# Patient Record
Sex: Female | Born: 1970 | Race: White | Hispanic: No | Marital: Married | Smoking: Former smoker
Health system: Southern US, Community
[De-identification: ages and names within clinical notes are randomized; demographics above are authoritative.]

## PROBLEM LIST (undated history)

## (undated) DIAGNOSIS — E039 Hypothyroidism, unspecified: Secondary | ICD-10-CM

## (undated) HISTORY — DX: Hypothyroidism, unspecified: E03.9

---
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
## 1997-06-28 ENCOUNTER — Other Ambulatory Visit: Admission: RE | Admit: 1997-06-28 | Discharge: 1997-06-28 | Payer: Self-pay | Admitting: Obstetrics and Gynecology

## 1998-07-05 ENCOUNTER — Other Ambulatory Visit: Admission: RE | Admit: 1998-07-05 | Discharge: 1998-07-05 | Payer: Self-pay | Admitting: Obstetrics and Gynecology

## 1999-09-29 ENCOUNTER — Other Ambulatory Visit: Admission: RE | Admit: 1999-09-29 | Discharge: 1999-09-29 | Payer: Self-pay | Admitting: *Deleted

## 2003-04-05 ENCOUNTER — Inpatient Hospital Stay (HOSPITAL_COMMUNITY): Admission: AD | Admit: 2003-04-05 | Discharge: 2003-04-05 | Payer: Self-pay | Admitting: Obstetrics & Gynecology

## 2003-10-06 ENCOUNTER — Inpatient Hospital Stay (HOSPITAL_COMMUNITY): Admission: AD | Admit: 2003-10-06 | Discharge: 2003-10-10 | Payer: Self-pay | Admitting: Obstetrics and Gynecology

## 2004-06-06 ENCOUNTER — Encounter: Admission: RE | Admit: 2004-06-06 | Discharge: 2004-06-06 | Payer: Self-pay | Admitting: Obstetrics and Gynecology

## 2004-08-21 ENCOUNTER — Ambulatory Visit: Payer: Self-pay

## 2006-05-31 ENCOUNTER — Inpatient Hospital Stay (HOSPITAL_COMMUNITY): Admission: AD | Admit: 2006-05-31 | Discharge: 2006-05-31 | Payer: Self-pay | Admitting: *Deleted

## 2006-07-12 ENCOUNTER — Inpatient Hospital Stay (HOSPITAL_COMMUNITY): Admission: RE | Admit: 2006-07-12 | Discharge: 2006-07-15 | Payer: Self-pay | Admitting: Obstetrics and Gynecology

## 2006-07-12 ENCOUNTER — Encounter (INDEPENDENT_AMBULATORY_CARE_PROVIDER_SITE_OTHER): Payer: Self-pay | Admitting: Specialist

## 2007-02-11 IMAGING — CR CERVICAL SPINE - COMPLETE 4+ VIEW
1 series · 6 of 6 positions shown · non-contrast
Comparison: none

REASON FOR EXAM: pain
COMMENTS:

[Series 1: view not recorded · 0.17mm/px · 6 of 6 slices shown]
[im 1/6]
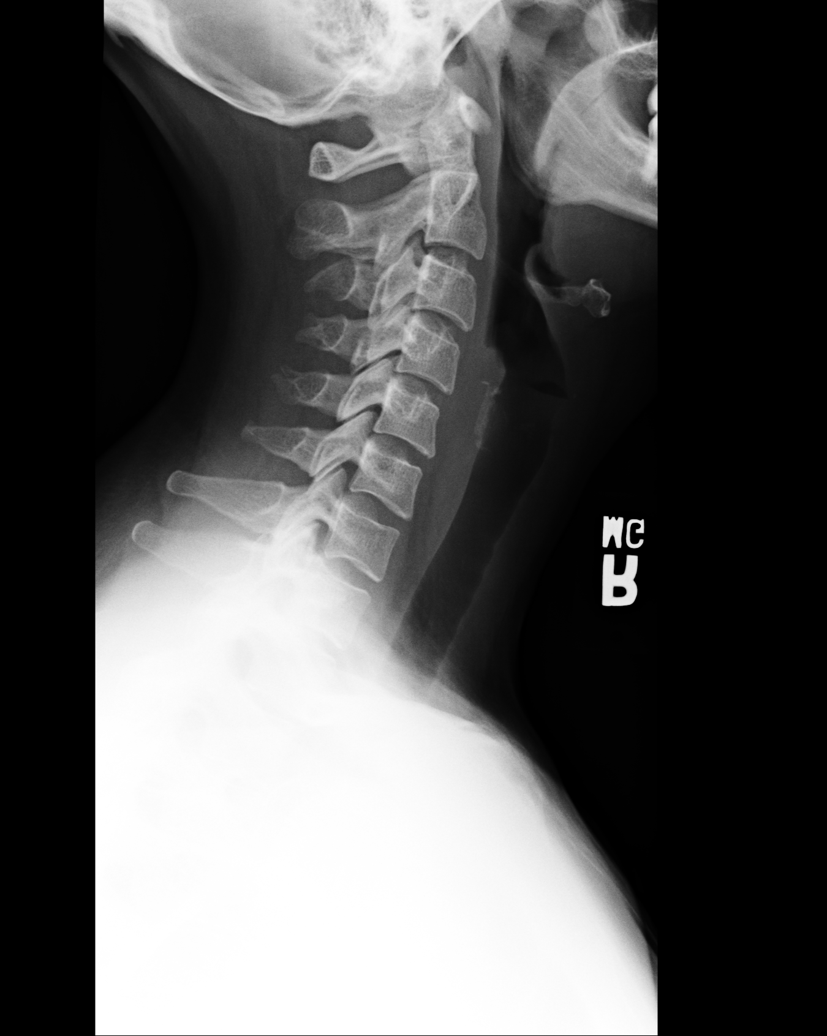
[im 2/6]
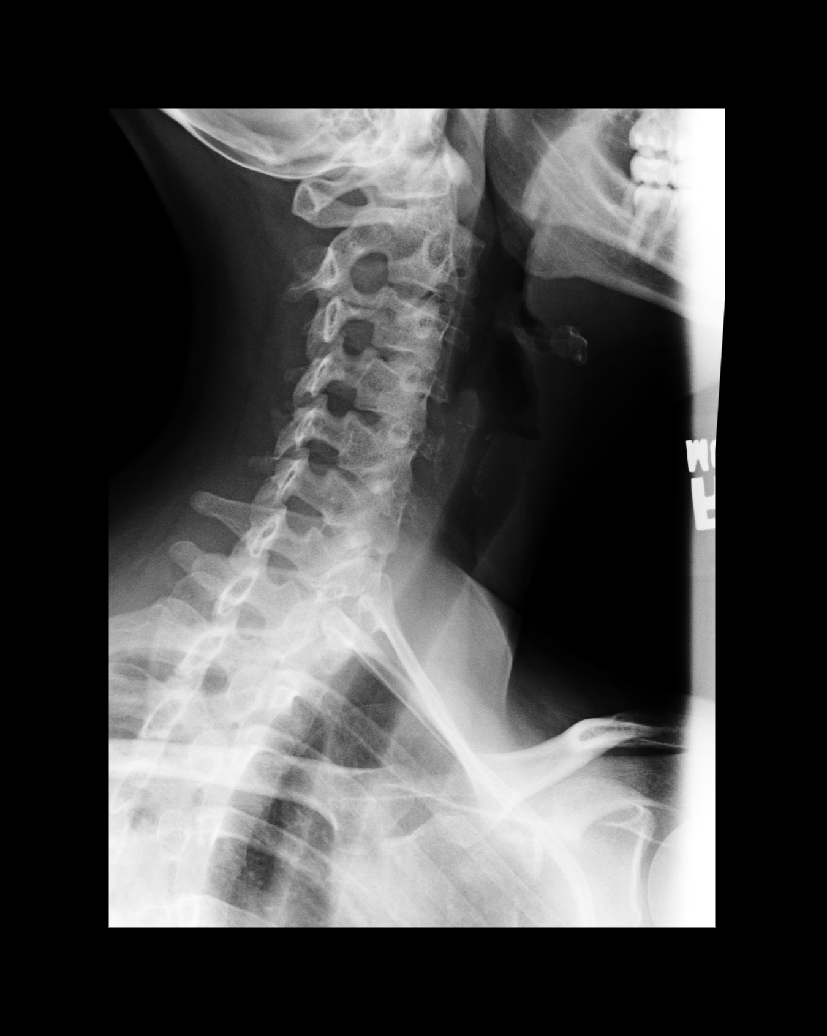
[im 3/6]
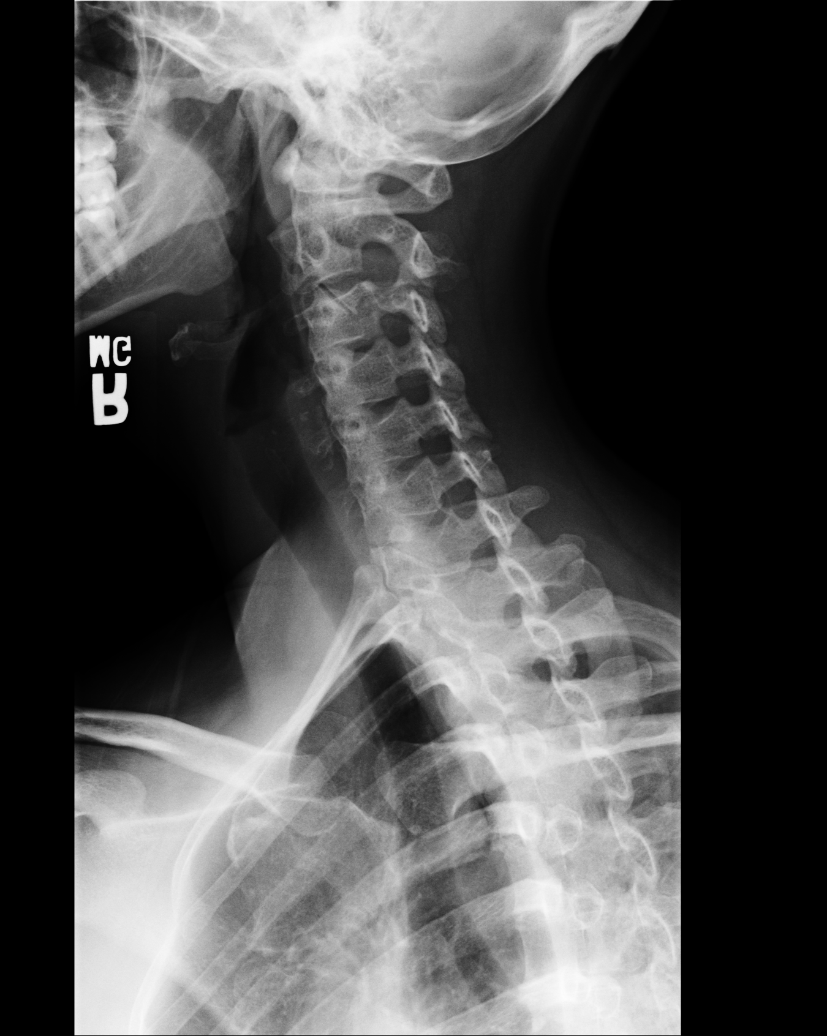
[im 4/6]
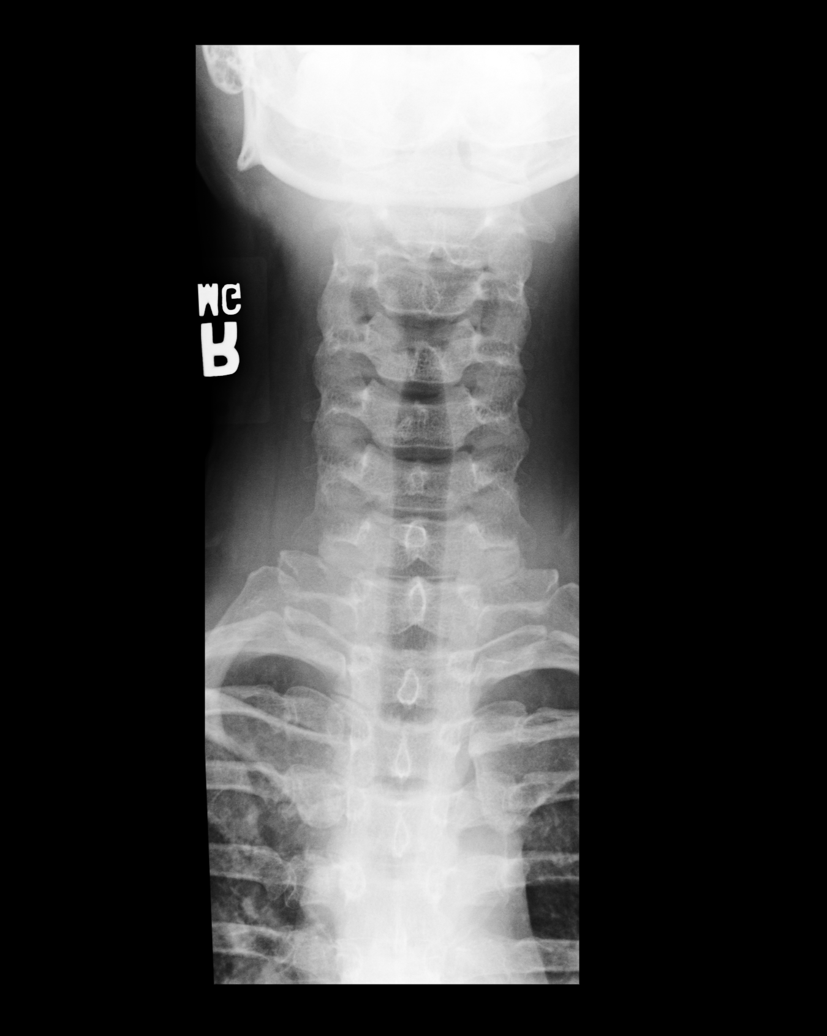
[im 5/6]
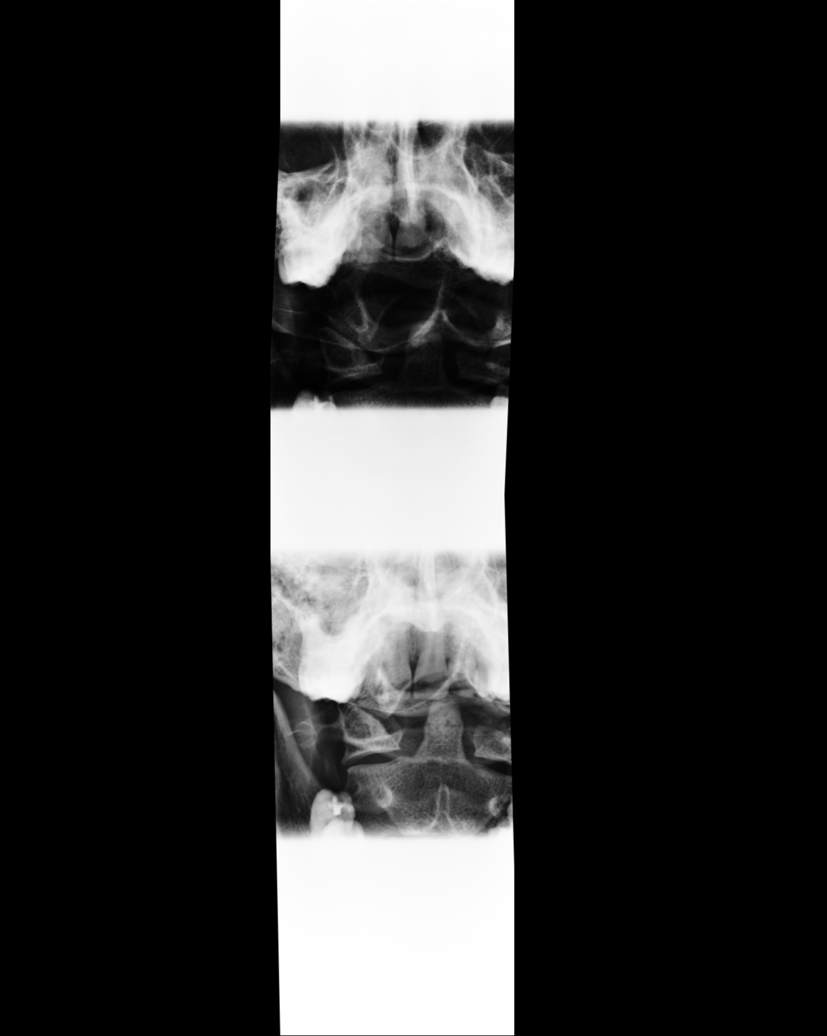
[im 6/6]
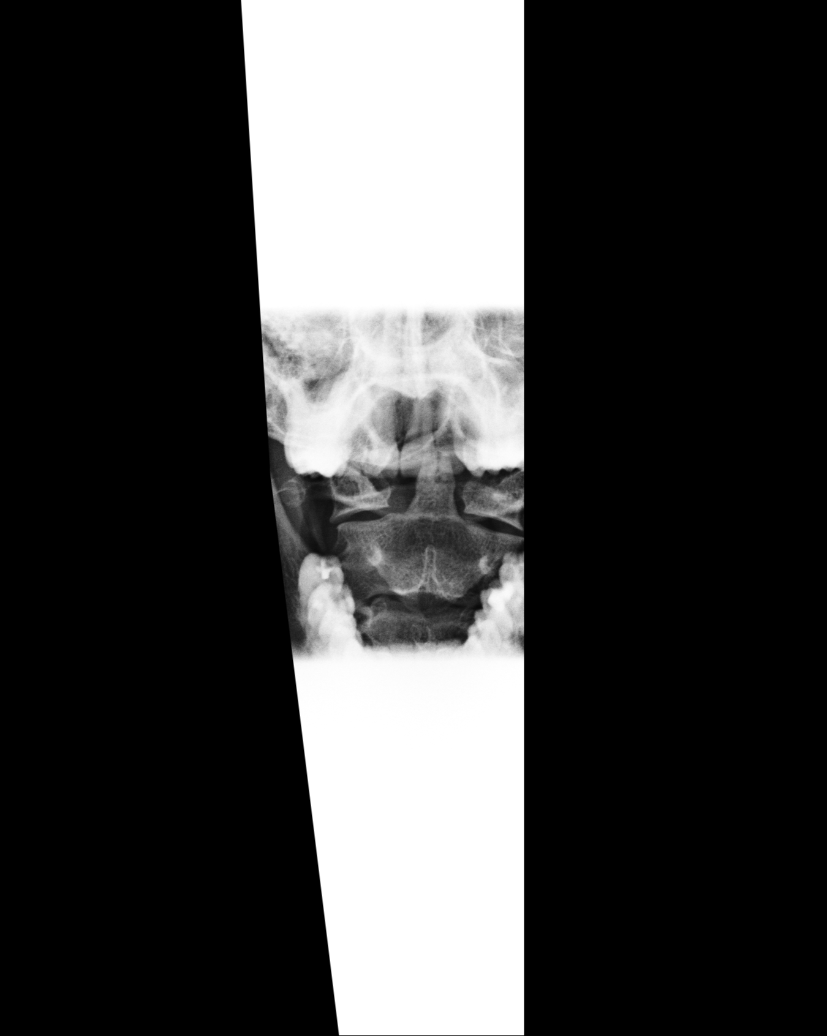

[6 of 6 positions shown; findings below may reference images not displayed]

PROCEDURE:     DXR - DXR CERVICAL SPINE COMPLETE  - August 21, 2004 [DATE]

RESULT:        The cervical vertebral bodies are preserved in height.  The
intervertebral disc space heights are well maintained.  The prevertebral
soft tissue spaces are normal.  The oblique views reveal no bony
encroachment upon the neural foramina.  The odontoid appears intact.  The
lateral masses of C1 articulate normally with those of C2.
IMPRESSION: I see no evidence of acute cervical spine abnormality.  If
the patient's symptoms persist, further evaluation with MRI may be of value.

## 2007-04-04 ENCOUNTER — Ambulatory Visit: Payer: Self-pay | Admitting: Internal Medicine

## 2007-04-10 ENCOUNTER — Ambulatory Visit: Payer: Self-pay | Admitting: Internal Medicine

## 2007-04-14 ENCOUNTER — Ambulatory Visit: Payer: Self-pay | Admitting: Internal Medicine

## 2008-04-15 ENCOUNTER — Ambulatory Visit: Payer: Self-pay | Admitting: Internal Medicine

## 2009-10-04 IMAGING — US US THYROID
1 series · 18 of 22 positions shown · non-contrast
Comparison: none

REASON FOR EXAM: enlarged thyroid
COMMENTS:

PROCEDURE:     US  - US THYROID  - April 14, 2007  [DATE]
RESULT:     The thyroid appears normal. No mass lesion is noted.  There is
no thyromegaly.

[Series 1: us thyroid · 18 of 22 slices shown]
[im 1/22]
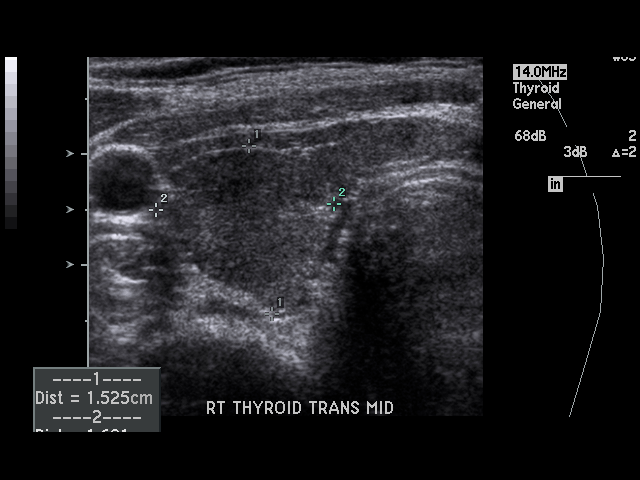
[im 2/22]
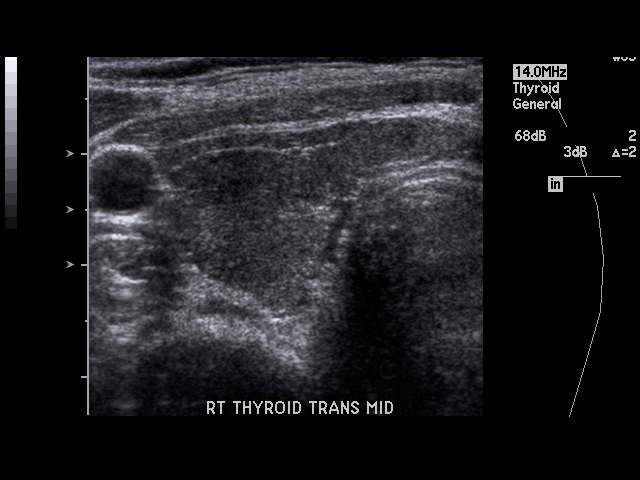
[im 4/22]
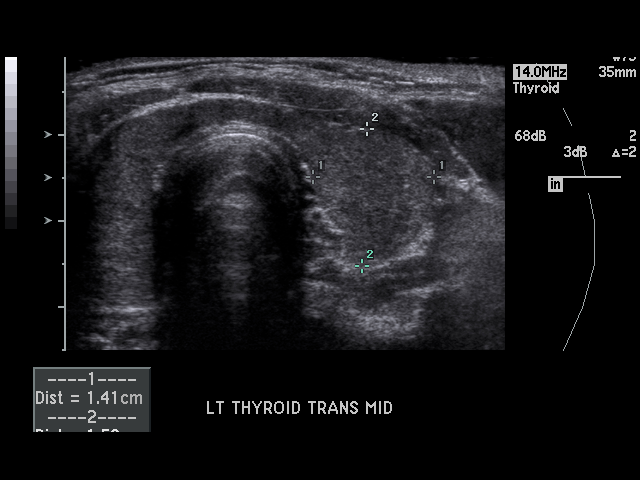
[im 5/22]
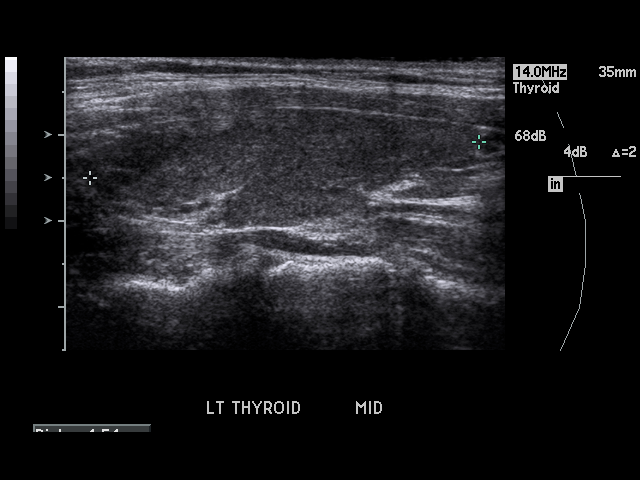
[im 6/22]
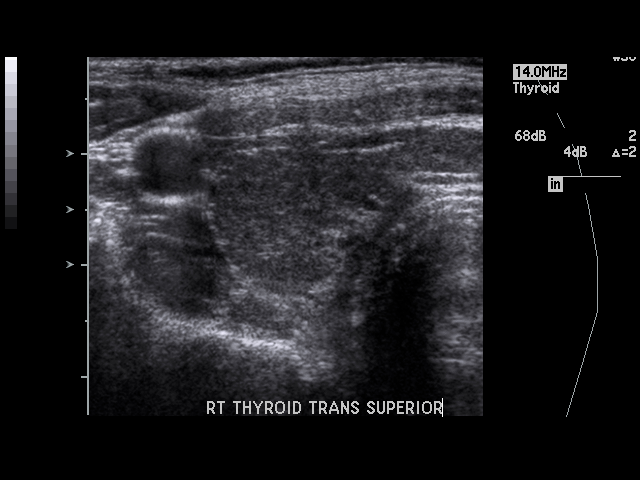
[im 7/22]
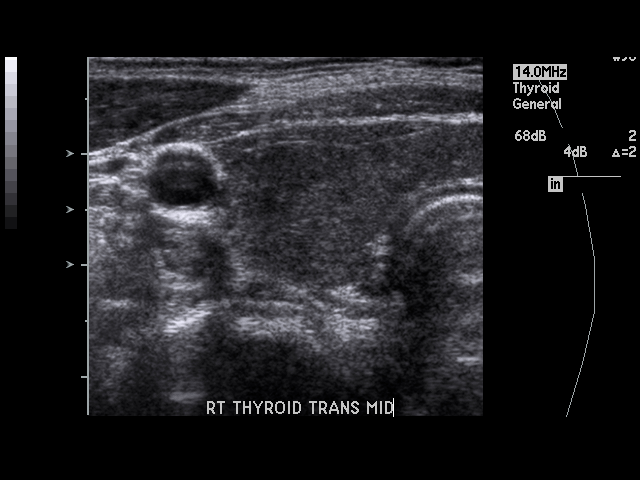
[im 8/22]
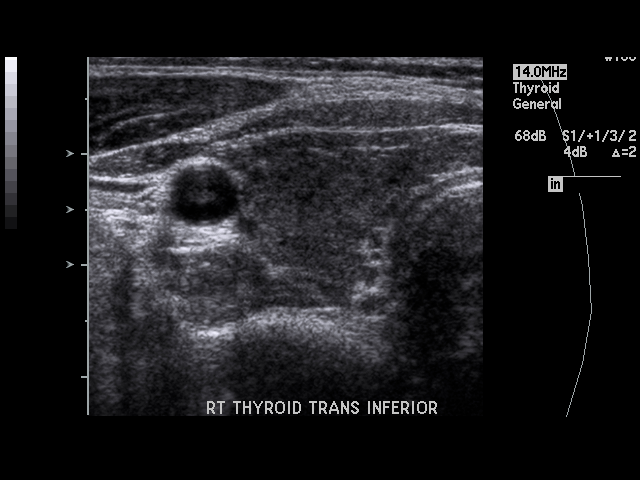
[im 10/22]
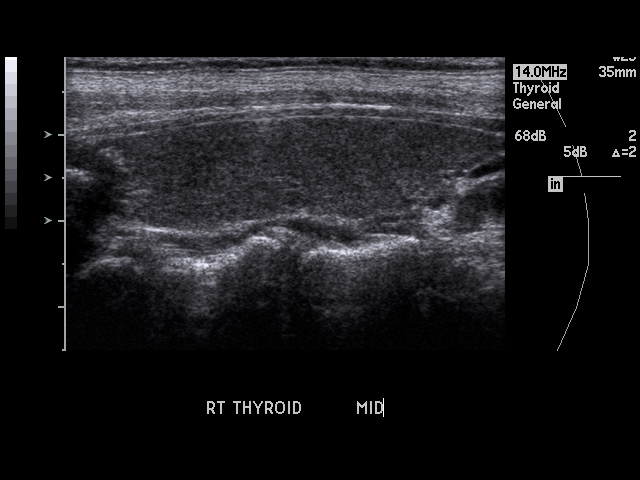
[im 11/22]
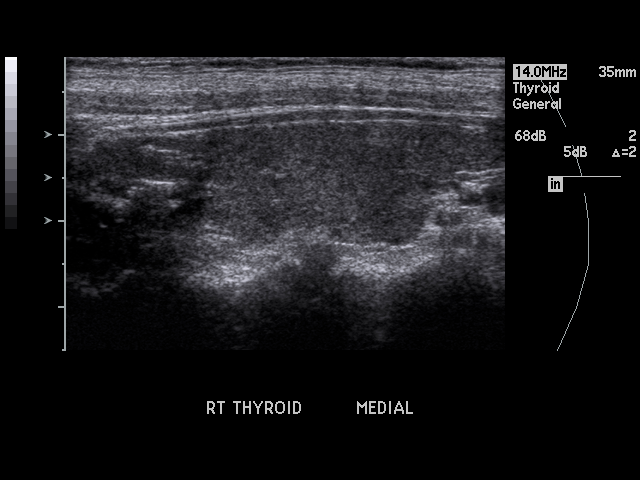
[im 12/22]
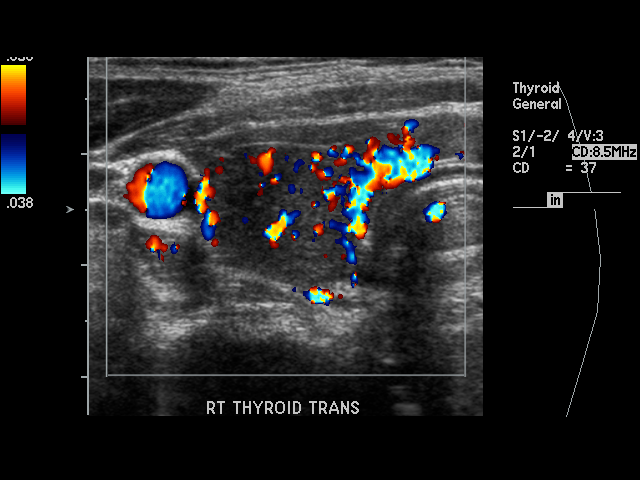
[im 13/22]
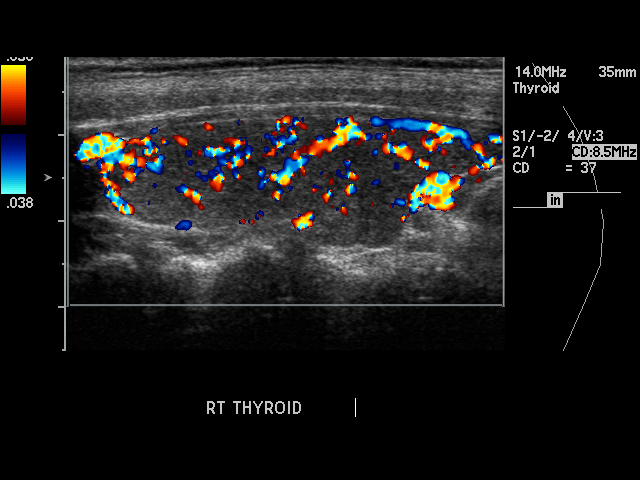
[im 15/22]
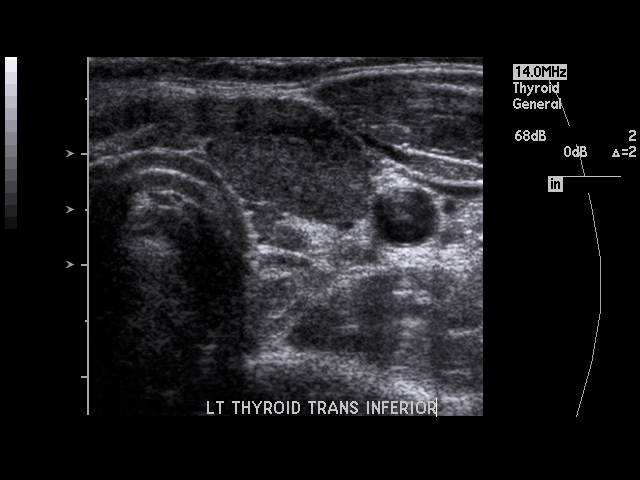
[im 16/22]
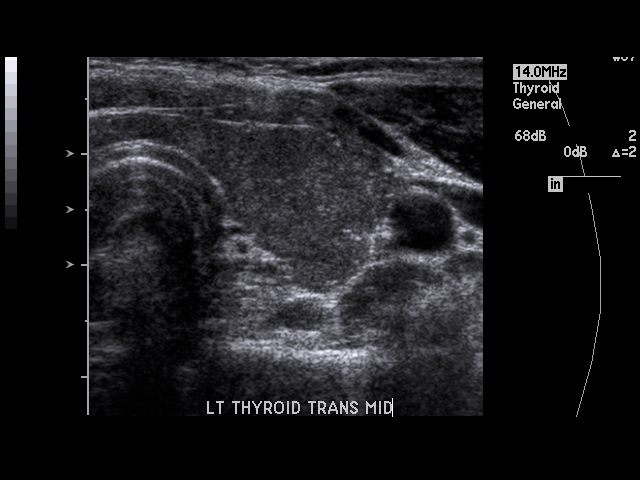
[im 17/22]
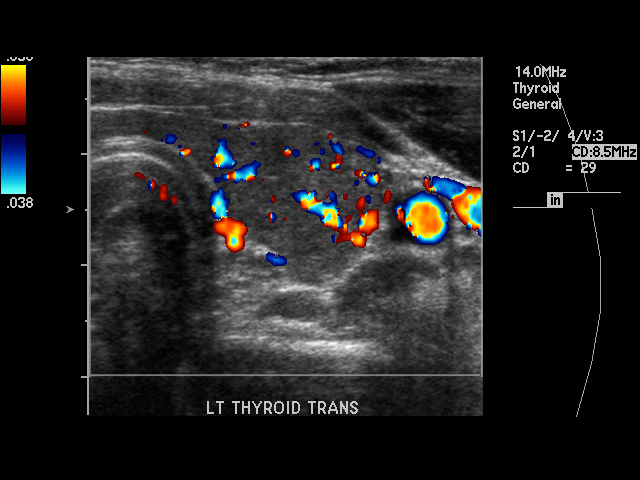
[im 18/22]
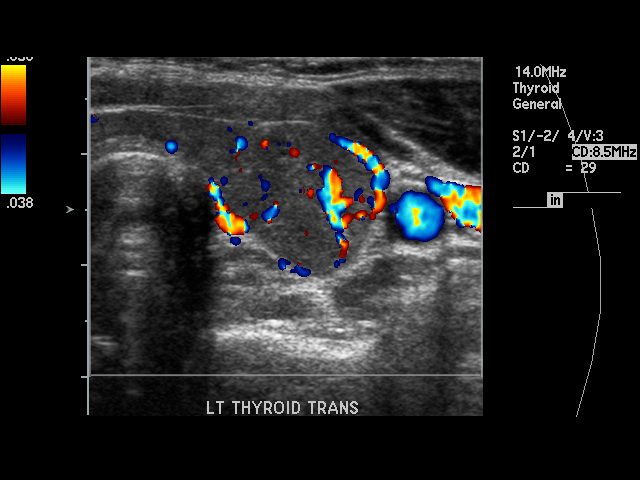
[im 19/22]
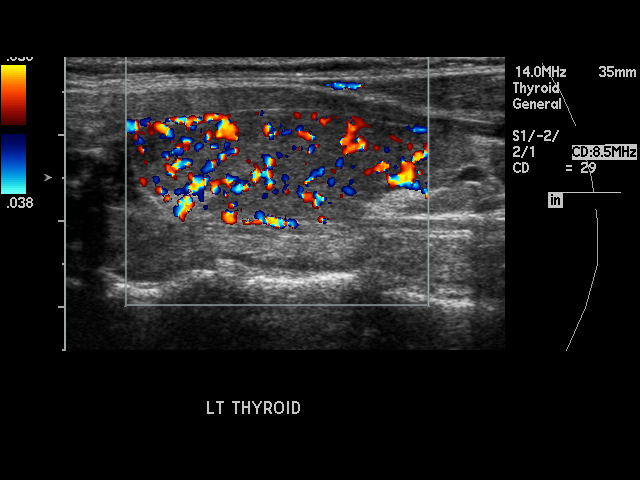
[im 21/22]
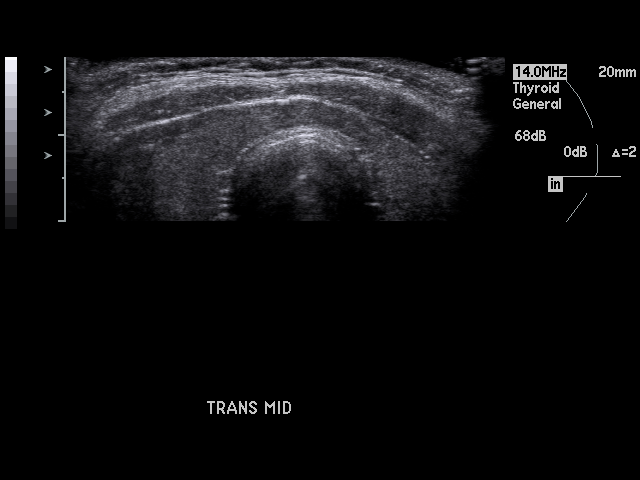
[im 22/22]
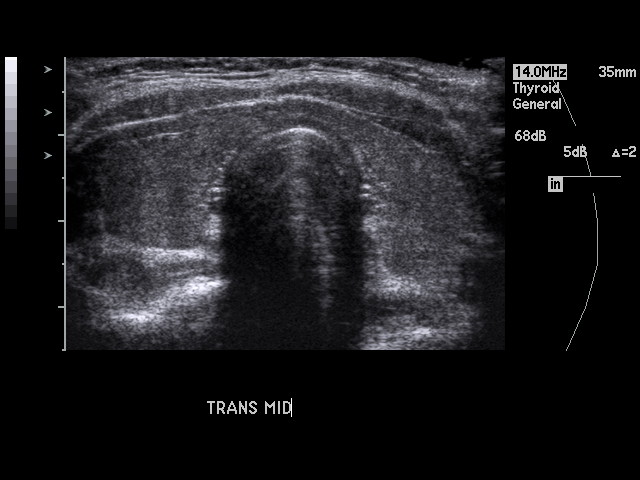

[18 of 22 positions shown; findings below may reference images not displayed]

IMPRESSION: 1)Normal thyroid exam.

## 2009-11-09 ENCOUNTER — Ambulatory Visit: Payer: Self-pay | Admitting: Internal Medicine

## 2009-12-06 ENCOUNTER — Ambulatory Visit: Payer: Self-pay | Admitting: Psychology

## 2009-12-13 ENCOUNTER — Ambulatory Visit: Payer: Self-pay | Admitting: Psychology

## 2010-07-21 NOTE — Discharge Summary (Signed)
Brittany Reed, Brittany Reed               ACCOUNT NO.:  [id number]   MEDICAL RECORD NO.:  [id number]          PATIENT TYPE:  INP   LOCATION:  9124                          FACILITY:  [facility]   PHYSICIAN:  Lenoard Aden, M.D.DATE OF BIRTH:  1970-06-26   DATE OF ADMISSION:  07/12/2006  DATE OF DISCHARGE:  07/15/2006                               DISCHARGE SUMMARY   The patient underwent uncomplicated repeat loose segment transverse  cesarean section and tubal ligation.  Postoperative course  uncomplicated.  Tolerated regular diet well.  Discharged to home day #3.  Discharge teaching done.  Tylox, prenatal vitamins and iron given.  Follow up in the office in 4-6 weeks.      Lenoard Aden, M.D.  Electronically Signed     RJT/MEDQ  D:  09/09/2006  T:  09/09/2006  Job:  [id number]

## 2010-07-21 NOTE — Op Note (Signed)
NAME:  Brittany Reed, Brittany Reed                         ACCOUNT NO.:  [id number]   MEDICAL RECORD NO.:  [id number]                   PATIENT TYPE:  INP   LOCATION:  9117                                 FACILITY:  [facility]   PHYSICIAN:  Gerri Spore B. Earlene Plater, M.D.               DATE OF BIRTH:  Sep 04, 1970   DATE OF PROCEDURE:  10/07/2003  DATE OF DISCHARGE:                                 OPERATIVE REPORT   PREOPERATIVE DIAGNOSES:  1. Term intrauterine pregnancy.  2. Failure to descend.   POSTOPERATIVE DIAGNOSES:  1. Term intrauterine pregnancy.  2. Failure to descend.   OPERATION PERFORMED:  Primary low transverse cesarean section.   SURGEON:  Chester Holstein. Earlene Plater, M.D.   ANESTHESIA:  Epidural.   FINDINGS:  A viable female infant, Apgars 8 and 9, OP position with moderate  meconium, normal-appearing uterus, tubes and ovaries.   INDICATIONS FOR PROCEDURE:  The patient present in spontaneous labor and was  augmented to complete and pushed for over two and a half hours without  descent beyond +2.  She was found to be OP with asynclitism noted.  There  was a significant amount of molding and caput which limited my ability to  ensure the exact position.  Given this, plus the labor course, I recommended  a cesarean section.  Operative risks were discussed including infection,  bleeding, damage to surrounding organs.   DESCRIPTION OF PROCEDURE:  The patient was taken to the operating room with  epidural anesthesia in place.  She was prepped and draped in standard  fashion and a Foley catheter was in the bladder.  A Pfannenstiel incision  was made with the knife and the fascia divided sharply.  Superior edge of  the fascia elevated and the underlying rectus muscles dissected off sharply.  Repeated inferiorly in a similar fashion.  Midline was identified and the  posterior sheath and peritoneum elevated and entered sharply.  Extended  inferiorly sharply.  Bladder blade inserted and a bladder flap created  with  sharp and blunt technique.  Uterine incision made in low transverse fashion  with a knife and extended laterally with bandage scissors.  At amniotomy,  moderate meconium was noted.   The infant's head was delivered through the incision, noted to be OP  position.  The nose and mouth were suctioned with the  DeLee and the  remainder of the infant delivered without difficulty.  The cord was clamped  and cut and the infant handed off to the waiting pediatricians.  A gram of  Ancef was given at cord clamp.  Cord blood was collected at patient's  request for storage.   The placenta was removed manually, the uterus was exteriorized and cleared  of all clots and debris.  There was an extension on each side that was  identified.  These were isolated and closed with a running locked stitch of  0 Vicryl.  The remainder  of the incision was closed in a similar fashion.  A  second imbricating layer placed with the same suture.   Attention was turned to the abdomen and the pelvis irrigated.  Incision  inspected and was hemostatic as was the bladder flap and subfascial space.  The fascia was closed with 0 Vicryl.  The subcutaneous tissue was irrigated  and made hemostatic with the Bovie and skin was closed with staples.  The  patient tolerated the procedure well.  There were no complications.  She was  taken to the recovery room in alert and stable condition.  All counts were  correct per the operating room  staff.                                               Gerri Spore B. Earlene Plater, M.D.    WBD/MEDQ  D:  10/07/2003  T:  10/07/2003  Job:  [id number]

## 2010-07-21 NOTE — Discharge Summary (Signed)
NAME:  Brittany Reed, Brittany Reed                         ACCOUNT NO.:  [id number]   MEDICAL RECORD NO.:  [id number]                   PATIENT TYPE:  INP   LOCATION:  9117                                 FACILITY:  [facility]   PHYSICIAN:  Gerri Spore B. Earlene Plater, M.D.               DATE OF BIRTH:  October 14, 1970   DATE OF ADMISSION:  10/06/2003  DATE OF DISCHARGE:  10/10/2003                                 DISCHARGE SUMMARY   ADMISSION DIAGNOSES:  1.  Term intrauterine pregnancy.  2.  Spontaneous labor.  3.  Failure to descend.   DISCHARGE DIAGNOSES:  1.  Term intrauterine pregnancy.  2.  Spontaneous labor.  3.  Failure to descend.   PROCEDURE:  Primary low transverse cesarean section.   HISTORY OF PRESENT ILLNESS:  A 40 year old white female gravida 3 para 0 A2  who presented in spontaneous labor at 39+ weeks.  Her labor was augmented.  She ultimately progressed to complete and pushed for over 2-and-a-half hours  without descent beyond a +2 station.  She was found to be OP with  asynclitism noted.  In addition, there was a significant amount of caput and  molding.  Given this plus the labor course, I recommended a cesarean section  as opposed to a trial of operative vaginal delivery.   The patient was subsequently delivered by a primary low transverse cesarean  section.  Findings included a viable female infant, Apgars 8 and 9, OP  position with moderate meconium.  Otherwise, normal-appearing uterus, tubes,  and ovaries.   Postoperatively, the patient rapidly regains her ability to ambulate, void,  and tolerate a regular diet.  She was discharged home on postoperative day  #3 in satisfactory condition.   DISCHARGE MEDICATIONS:  1.  Chromagen Forte one tablet b.i.d.  2.  Tylox one to two p.o. q.4-6h. p.r.n. pain.   FOLLOW-UP:  [facility] in 1-2 days for incision check and staple  removal, and 4 weeks for a postpartum checkup.   DISCHARGE INSTRUCTIONS:  Standard preprinted instructions given  prior to  dismissal.   DISPOSITION AT DISCHARGE:  Satisfactory.                                              Gerri Spore B. Earlene Plater, M.D.   WBD/MEDQ  D:  11/04/2003  T:  11/05/2003  Job:  [id number]

## 2010-07-21 NOTE — Op Note (Signed)
NAMEVIRGENE, Brittany Reed               ACCOUNT NO.:  [id number]   MEDICAL RECORD NO.:  [id number]          PATIENT TYPE:  INP   LOCATION:  9199                          FACILITY:  [facility]   PHYSICIAN:  Lenoard Aden, M.D.DATE OF BIRTH:  15-May-1970   DATE OF PROCEDURE:  07/12/2006  DATE OF DISCHARGE:                               OPERATIVE REPORT   PREOPERATIVE DIAGNOSIS:  Term intrauterine pregnancy, previous cesarean  section x2 for repeat, and tubal ligation.   POSTOPERATIVE DIAGNOSIS:  Term intrauterine pregnancy, previous cesarean  section x2 for repeat, and tubal ligation.   PROCEDURE:  Low transverse cesarean section and tubal ligation.   SURGEON:  Lenoard Aden, M.D.   ASSISTANT:  Marlinda Mike, C.N.M.   ANESTHESIA:  Spinal by Quillian Quince, M.D.   ESTIMATED BLOOD LOSS:  1000 mL.   COMPLICATIONS:  None.   DRAINS:  Foley.   COUNTS:  Correct.  The patient to recovery room in good condition.   SPECIMENS:  Placenta and tubal segments to pathology.   FINDINGS:  Normal uterus with multiple anterior wall adhesions, normal  tubes and normal ovaries.  Normal lower uterine segment.  Single layer  closure of the uterus.   DESCRIPTION OF PROCEDURE:  After being apprised of the risks of  anesthesia, infection, bleeding, injury to abdominal organs with need  for repair, the patient was brought to the operating room where she was  administered a spinal anesthetic without complications.  Prepped in the  usual sterile fashion.  Foley catheter placed after achieving adequate  anesthesia, dilute Marcaine solution was placed.  Foley catheter was  placed.  A Pfannenstiel skin incision was made with a scalpel and  carried down to the fascia which was nicked in the midline and opened  transversely using Mayo scissors.  The rectus muscles were dissected  sharply in the midline, and peritoneum entered sharply.  After achieving  peritoneal entry, it was noted multiple adhesions of the  anterior lower  uterine segment to the abdominal wall which were lysed without  difficulty.  Bladder blade was placed.  Lower uterine segment was  identified.  Bladder flap was extended sharply off the lower uterine  segment and Kerr hysterotomy incision was made.  Atraumatic delivery  after Sharl Ma hysterotomy incision of a full term living female, handed to  pediatricians in attendance.  Apgars 8 and 9.  Cord blood collected and  placenta delivered manually intact.  Three vessel cord noted.  Uterus  was exteriorized and closed in a single layer of 0 Monocryl continuous  running closure.  Bladder flap inspected and found to be hemostatic.  The left tube was traced out to the fimbriated end.  Ampullary isthmic  section of the tube was identified.  Avascular portion of the  mesosalpinx was noted and cauterized creating a window.  0 plain ties  were placed proximally and distally.  Tubal segment is removed.  Tubal  lumen are identified and cauterized.  The same procedure was done on the  left tube as done on the right tube and right tubal segment removed.  After achieving  good hemostasis, bladder flap reinspected and uterine  incision reinspected and found to be hemostatic.  The rectus muscles  were reapproximated with their fascial attachments in the midline using  2-0 chromic suture.  The fascia was then closed using 0 Monocryl in  continuous running fashion.  The skin closed using staples.  The patient  tolerated the procedure well and is transferred to the recovery room in  good condition.      Lenoard Aden, M.D.  Electronically Signed     RJT/MEDQ  D:  07/12/2006  T:  07/12/2006  Job:  [id number]

## 2011-01-16 ENCOUNTER — Ambulatory Visit: Payer: Self-pay | Admitting: Internal Medicine

## 2011-01-29 ENCOUNTER — Ambulatory Visit: Payer: Self-pay | Admitting: Internal Medicine

## 2012-03-07 ENCOUNTER — Ambulatory Visit: Payer: Self-pay

## 2013-04-08 ENCOUNTER — Other Ambulatory Visit: Payer: Self-pay | Admitting: Internal Medicine

## 2013-04-08 DIAGNOSIS — Z1231 Encounter for screening mammogram for malignant neoplasm of breast: Secondary | ICD-10-CM

## 2013-04-27 ENCOUNTER — Ambulatory Visit
Admission: RE | Admit: 2013-04-27 | Discharge: 2013-04-27 | Disposition: A | Discharge status: Home / Self Care | Payer: No Typology Code available for payment source | Source: Ambulatory Visit | Attending: Internal Medicine | Admitting: Internal Medicine

## 2013-04-27 DIAGNOSIS — Z1231 Encounter for screening mammogram for malignant neoplasm of breast: Secondary | ICD-10-CM

## 2013-04-28 ENCOUNTER — Ambulatory Visit: Payer: Self-pay

## 2017-06-12 ENCOUNTER — Other Ambulatory Visit: Payer: Self-pay | Admitting: Internal Medicine

## 2017-11-11 ENCOUNTER — Encounter: Payer: Self-pay | Admitting: Nurse Practitioner

## 2017-11-11 ENCOUNTER — Ambulatory Visit: Payer: BLUE CROSS/BLUE SHIELD | Admitting: Nurse Practitioner

## 2017-11-11 VITALS — BP 106/60 | HR 47 | Resp 16 | Ht 64.0 in | Wt 153.6 lb

## 2017-11-11 DIAGNOSIS — Z0001 Encounter for general adult medical examination with abnormal findings: Secondary | ICD-10-CM

## 2017-11-11 DIAGNOSIS — J0140 Acute pansinusitis, unspecified: Secondary | ICD-10-CM | POA: Diagnosis not present

## 2017-11-11 DIAGNOSIS — E039 Hypothyroidism, unspecified: Secondary | ICD-10-CM

## 2017-11-11 DIAGNOSIS — E559 Vitamin D deficiency, unspecified: Secondary | ICD-10-CM

## 2017-11-11 MED ORDER — AMOXICILLIN 875 MG PO TABS
875.0000 mg | ORAL_TABLET | Freq: Two times a day (BID) | ORAL | 0 refills | Status: DC
Start: 2017-11-11 — End: 2018-06-25

## 2017-11-11 NOTE — Progress Notes (Signed)
[facility] [address] [place], [place] [address]  Internal MEDICINE  Office Visit Note  Patient Name: Brittany Reed  [id number]  [id number]  Date of Service: 11/11/2017  Chief Complaint  Patient presents with  . Sinusitis    12 month follow up  . Hypothyroidism    Sinusitis  This is a new problem. The current episode started in the past 7 days. The problem has been gradually improving since onset. There has been no fever. The fever has been present for less than 1 day. Associated symptoms include congestion, coughing, ear pain, headaches, a hoarse voice and a sore throat. Pertinent negatives include no chills. Past treatments include nothing.       Current Medication: Outpatient Encounter Medications as of 11/11/2017  Medication Sig  . levothyroxine (SYNTHROID, LEVOTHROID) 88 MCG tablet TAKE 1 TABLET IN THE MORNING BEFORE BREAKFAST FOR THYROID  . amoxicillin (AMOXIL) 875 MG tablet Take 1 tablet (875 mg total) by mouth 2 (two) times daily.   No facility-administered encounter medications on file as of 11/11/2017.     Surgical History: Past Surgical History:  Procedure Laterality Date  . APPENDECTOMY  1976  . CESAREAN SECTION     2005, 2008    Medical History: Past Medical History:  Diagnosis Date  . Hypothyroidism     Family History: Family History  Problem Relation Age of Onset  . Hypertension Mother   . Arthritis Mother   . Hyperlipidemia Father   . Hypertension Father   . Heart disease Father   . Prostate cancer Father   . Stroke Father   . Liver cancer Maternal Grandmother   . Pancreatic cancer Maternal Grandmother     Social History   Socioeconomic History  . Marital status: Married    Spouse name: Not on file  . Number of children: Not on file  . Years of education: Not on file  . Highest education level: Not on file  Occupational History  . Not on file  Social Needs  . Financial resource strain: Not on file  . Food insecurity:     Worry: Not on file    Inability: Not on file  . Transportation needs:    Medical: Not on file    Non-medical: Not on file  Tobacco Use  . Smoking status: Former Games developer  . Smokeless tobacco: Never Used  Substance and Sexual Activity  . Alcohol use: Never    Frequency: Never  . Drug use: Not on file  . Sexual activity: Not on file  Lifestyle  . Physical activity:    Days per week: Not on file    Minutes per session: Not on file  . Stress: Not on file  Relationships  . Social connections:    Talks on phone: Not on file    Gets together: Not on file    Attends religious service: Not on file    Active member of club or organization: Not on file    Attends meetings of clubs or organizations: Not on file    Relationship status: Not on file  . Intimate partner violence:    Fear of current or ex partner: Not on file    Emotionally abused: Not on file    Physically abused: Not on file    Forced sexual activity: Not on file  Other Topics Concern  . Not on file  Social History Narrative  . Not on file      Review of Systems  Constitutional:  Negative for activity change, chills, fatigue and fever.  HENT: Positive for congestion, ear pain, hoarse voice, postnasal drip, rhinorrhea and sore throat. Negative for sinus pain and voice change.   Eyes: Negative.   Respiratory: Positive for cough. Negative for chest tightness and wheezing.   Cardiovascular: Negative for chest pain and palpitations.  Gastrointestinal: Negative for abdominal distention, abdominal pain, constipation, diarrhea, nausea and vomiting.  Endocrine: Negative for cold intolerance, heat intolerance, polydipsia, polyphagia and polyuria.       Chronic, well-managed hypothyroid.  Genitourinary: Negative.   Musculoskeletal: Negative for arthralgias and myalgias.  Skin: Negative for rash.  Allergic/Immunologic: Positive for environmental allergies.  Neurological: Positive for headaches.  Hematological: Negative for  adenopathy.  Psychiatric/Behavioral: Negative.     Vital Signs: BP 106/60 (BP Location: Right Arm, Patient Position: Sitting, Cuff Size: Normal)   Pulse (!) 47   Resp 16   Ht 5\' 4"  (1.626 m)   Wt 153 lb 9.6 oz (69.7 kg)   SpO2 96%   BMI 26.37 kg/m    Physical Exam  Constitutional: She is oriented to person, place, and time. She appears well-developed and well-nourished. No distress.  HENT:  Head: Normocephalic and atraumatic.  Nose: Rhinorrhea present.  Mouth/Throat: Posterior oropharyngeal edema present. No oropharyngeal exudate.  Eyes: Pupils are equal, round, and reactive to light. Conjunctivae and EOM are normal.  Neck: Normal range of motion. Neck supple. No JVD present. No tracheal deviation present. No thyromegaly present.  Cardiovascular: Normal rate, regular rhythm and normal heart sounds. Exam reveals no gallop and no friction rub.  No murmur heard. Pulmonary/Chest: Effort normal and breath sounds normal. No respiratory distress. She has no wheezes. She has no rales. She exhibits no tenderness.  Abdominal: Soft. Bowel sounds are normal.  Musculoskeletal: Normal range of motion.  Lymphadenopathy:    She has no cervical adenopathy.  Neurological: She is alert and oriented to person, place, and time. No cranial nerve deficit.  Skin: Skin is warm and dry. She is not diaphoretic.  Psychiatric: She has a normal mood and affect. Her behavior is normal. Judgment and thought content normal.  Nursing note and vitals reviewed.  Assessment/Plan: 1. Acute non-recurrent pansinusitis Treat with amoxicillin 875mg  bid for 10 days for symptoms that do not improve or worsen over the next few days. Recommend warm salt water gargles as needed for sore throat. Use OTC medications to help symptoms.  - amoxicillin (AMOXIL) 875 MG tablet; Take 1 tablet (875 mg total) by mouth 2 (two) times daily.  Dispense: 20 tablet; Refill: 0  2. Hypothyroidism, unspecified type Check thyroid panel and  adjust levothyroxine dose as indicated.   General Counseling: Caeleigh verbalizes understanding of the findings of todays visit and agrees with plan of treatment. I have discussed any further diagnostic evaluation that may be needed or ordered today. We also reviewed her medications today. she has been encouraged to call the office with any questions or concerns that should arise related to todays visit.   This patient was seen by Vincent Gros FNP Collaboration with Dr Lyndon Code as a part of collaborative care agreement  Meds ordered this encounter  Medications  . amoxicillin (AMOXIL) 875 MG tablet    Sig: Take 1 tablet (875 mg total) by mouth 2 (two) times daily.    Dispense:  20 tablet    Refill:  0    Order Specific Question:   Supervising Provider    Answer:   Lyndon Code [[id number]]  Time spent: 13 Minutes      Dr Lavera Guise Internal medicine

## 2017-11-16 LAB — CBC WITH DIFFERENTIAL/PLATELET
Basophils Absolute: 0 10*3/uL (ref 0.0–0.2)
Basos: 0 %
EOS (ABSOLUTE): 0.3 10*3/uL (ref 0.0–0.4)
Eos: 3 %
Hematocrit: 37.2 % (ref 34.0–46.6)
Hemoglobin: 12.2 g/dL (ref 11.1–15.9)
Immature Grans (Abs): 0 10*3/uL (ref 0.0–0.1)
Immature Granulocytes: 0 %
LYMPHS ABS: 1.6 10*3/uL (ref 0.7–3.1)
Lymphs: 20 %
MCH: 30.1 pg (ref 26.6–33.0)
MCHC: 32.8 g/dL (ref 31.5–35.7)
MCV: 92 fL (ref 79–97)
MONOS ABS: 0.9 10*3/uL (ref 0.1–0.9)
Monocytes: 11 %
Neutrophils Absolute: 5.3 10*3/uL (ref 1.4–7.0)
Neutrophils: 66 %
PLATELETS: 355 10*3/uL (ref 150–450)
RBC: 4.05 x10E6/uL (ref 3.77–5.28)
RDW: 14 % (ref 12.3–15.4)
WBC: 8.1 10*3/uL (ref 3.4–10.8)

## 2017-11-16 LAB — COMPREHENSIVE METABOLIC PANEL
A/G RATIO: 2 (ref 1.2–2.2)
ALK PHOS: 54 IU/L (ref 39–117)
ALT: 16 IU/L (ref 0–32)
AST: 18 IU/L (ref 0–40)
Albumin: 4.4 g/dL (ref 3.5–5.5)
BUN/Creatinine Ratio: 13 (ref 9–23)
BUN: 10 mg/dL (ref 6–24)
Bilirubin Total: 0.3 mg/dL (ref 0.0–1.2)
CALCIUM: 9 mg/dL (ref 8.7–10.2)
CO2: 24 mmol/L (ref 20–29)
Chloride: 104 mmol/L (ref 96–106)
Creatinine, Ser: 0.77 mg/dL (ref 0.57–1.00)
GFR calc Af Amer: 106 mL/min/{1.73_m2} (ref >59)
GFR, EST NON AFRICAN AMERICAN: 92 mL/min/{1.73_m2} (ref >59)
GLOBULIN, TOTAL: 2.2 g/dL (ref 1.5–4.5)
Glucose: 88 mg/dL (ref 65–99)
POTASSIUM: 4.5 mmol/L (ref 3.5–5.2)
SODIUM: 140 mmol/L (ref 134–144)
Total Protein: 6.6 g/dL (ref 6.0–8.5)

## 2017-11-16 LAB — T4, FREE: FREE T4: 1.57 ng/dL (ref 0.82–1.77)

## 2017-11-16 LAB — LIPID PANEL
CHOLESTEROL TOTAL: 191 mg/dL (ref 100–199)
Chol/HDL Ratio: 4.1 ratio (ref 0.0–4.4)
HDL: 47 mg/dL (ref >39)
LDL Calculated: 124 mg/dL — ABNORMAL HIGH (ref 0–99)
Triglycerides: 98 mg/dL (ref 0–149)
VLDL CHOLESTEROL CAL: 20 mg/dL (ref 5–40)

## 2017-11-16 LAB — TSH: TSH: 1.67 u[IU]/mL (ref 0.450–4.500)

## 2017-11-16 LAB — HGB A1C W/O EAG: HEMOGLOBIN A1C: 5.4 % (ref 4.8–5.6)

## 2017-11-16 LAB — VITAMIN D 1,25 DIHYDROXY
VITAMIN D 1, 25 (OH) TOTAL: 28 pg/mL
VITAMIN D3 1, 25 (OH): 28 pg/mL
Vitamin D2 1, 25 (OH)2: <10 pg/mL

## 2017-11-18 ENCOUNTER — Telehealth: Payer: Self-pay

## 2017-11-18 NOTE — Telephone Encounter (Signed)
Pt advised labs look good  

## 2017-12-09 ENCOUNTER — Other Ambulatory Visit: Payer: Self-pay | Admitting: Internal Medicine

## 2018-04-14 DIAGNOSIS — Z113 Encounter for screening for infections with a predominantly sexual mode of transmission: Secondary | ICD-10-CM | POA: Diagnosis not present

## 2018-04-14 DIAGNOSIS — Z1151 Encounter for screening for human papillomavirus (HPV): Secondary | ICD-10-CM | POA: Diagnosis not present

## 2018-04-14 DIAGNOSIS — Z6826 Body mass index (BMI) 26.0-26.9, adult: Secondary | ICD-10-CM | POA: Diagnosis not present

## 2018-04-14 DIAGNOSIS — Z1231 Encounter for screening mammogram for malignant neoplasm of breast: Secondary | ICD-10-CM | POA: Diagnosis not present

## 2018-04-14 DIAGNOSIS — Z01419 Encounter for gynecological examination (general) (routine) without abnormal findings: Secondary | ICD-10-CM | POA: Diagnosis not present

## 2018-06-16 ENCOUNTER — Other Ambulatory Visit: Payer: Self-pay

## 2018-06-16 MED ORDER — LEVOTHYROXINE SODIUM 88 MCG PO TABS: ORAL_TABLET | ORAL | 0 refills | Status: DC

## 2018-06-25 ENCOUNTER — Ambulatory Visit: Payer: BLUE CROSS/BLUE SHIELD | Admitting: Nurse Practitioner

## 2018-06-25 ENCOUNTER — Other Ambulatory Visit: Payer: Self-pay

## 2018-06-25 ENCOUNTER — Encounter: Payer: Self-pay | Admitting: Nurse Practitioner

## 2018-06-25 VITALS — Resp 16 | Ht 64.0 in | Wt 159.0 lb

## 2018-06-25 DIAGNOSIS — I8312 Varicose veins of left lower extremity with inflammation: Secondary | ICD-10-CM | POA: Diagnosis not present

## 2018-06-25 NOTE — Progress Notes (Signed)
[facility] [address] [place], [place] [address]  Internal MEDICINE  Telephone Visit  Patient Name: Brittany NeitherSabrina W Reed  [id number] 14, 2072  [id number]  Date of Service: 06/25/2018  I connected with the patient at 3:39am by webcam and verified the patients identity using two identifiers.   I discussed the limitations, risks, security and privacy concerns of performing an evaluation and management service by webcam and the availability of in person appointments. I also discussed with the patient that there may be a patient responsible charge related to the service.  The patient expressed understanding and agrees to proceed.    Chief Complaint  Patient presents with  . Telephone Assessment  . Telephone Screen  . Rash    left leg    The patient has been contacted via webcam for follow up visit due to concerns for spread of novel coronavirus. She has noted mottled skin which starts around the inner left calf and goes up to her inner-mid thigh. Does not itch or hurt. No swelling present in the left lower leg. Mottling looks similar to bruising, taking the pattern of underlying superficial veins. There is no distinct rash. She denies fever, chills, or body aches.       Current Medication: Outpatient Encounter Medications as of 06/25/2018  Medication Sig  . levothyroxine (SYNTHROID, LEVOTHROID) 88 MCG tablet TAKE 1 TABLET BY MOUTH IN THE MORNING BEFORE BREAKFAST FOR THYROID  . [DISCONTINUED] amoxicillin (AMOXIL) 875 MG tablet Take 1 tablet (875 mg total) by mouth 2 (two) times daily. (Patient not taking: Reported on 06/25/2018)   No facility-administered encounter medications on file as of 06/25/2018.     Surgical History: Past Surgical History:  Procedure Laterality Date  . APPENDECTOMY  1976  . CESAREAN SECTION     2005, 2008    Medical History: Past Medical History:  Diagnosis Date  . Hypothyroidism     Family History: Family History  Problem Relation Age of Onset  .  Hypertension Mother   . Arthritis Mother   . Hyperlipidemia Father   . Hypertension Father   . Heart disease Father   . Prostate cancer Father   . Stroke Father   . Liver cancer Maternal Grandmother   . Pancreatic cancer Maternal Grandmother     Social History   Socioeconomic History  . Marital status: Married    Spouse name: Not on file  . Number of children: Not on file  . Years of education: Not on file  . Highest education level: Not on file  Occupational History  . Not on file  Social Needs  . Financial resource strain: Not on file  . Food insecurity:    Worry: Not on file    Inability: Not on file  . Transportation needs:    Medical: Not on file    Non-medical: Not on file  Tobacco Use  . Smoking status: Former Games developermoker  . Smokeless tobacco: Never Used  Substance and Sexual Activity  . Alcohol use: Never    Frequency: Never  . Drug use: Not on file  . Sexual activity: Not on file  Lifestyle  . Physical activity:    Days per week: Not on file    Minutes per session: Not on file  . Stress: Not on file  Relationships  . Social connections:    Talks on phone: Not on file    Gets together: Not on file    Attends religious service: Not on file    Active member  of club or organization: Not on file    Attends meetings of clubs or organizations: Not on file    Relationship status: Not on file  . Intimate partner violence:    Fear of current or ex partner: Not on file    Emotionally abused: Not on file    Physically abused: Not on file    Forced sexual activity: Not on file  Other Topics Concern  . Not on file  Social History Narrative  . Not on file      Review of Systems  Constitutional: Negative for chills, fatigue and unexpected weight change.  HENT: Negative for congestion, postnasal drip, rhinorrhea, sneezing and sore throat.   Respiratory: Negative for cough, chest tightness and shortness of breath.   Cardiovascular: Negative for chest pain and  palpitations.  Gastrointestinal: Negative for abdominal pain, constipation, diarrhea, nausea and vomiting.  Musculoskeletal: Negative for arthralgias, back pain, joint swelling and neck pain.  Skin: Positive for color change. Negative for rash.       Bruising/mottling to the skin on left lower leg stretching into the left upper leg. No rash. No swelling, pain, or itching assoicated with this.   Neurological: Negative.  Negative for tremors and numbness.  Hematological: Negative for adenopathy. Does not bruise/bleed easily.  Psychiatric/Behavioral: Negative for behavioral problems (Depression), sleep disturbance and suicidal ideas. The patient is not nervous/anxious.    Today's Vitals   06/25/18 0857  Resp: 16  Weight: 159 lb (72.1 kg)  Height: 5\' 4"  (1.626 m)   Body mass index is 27.29 kg/m.  Observation/Objective:  The patient is alert and oriented. She is in no acute distress. She has bruising type discoloration of the skin of medial left lower leg, now stretching into the left medial upper leg. No distinct rash present. Capillary refills is <3sec. No edema or swelling present. Does not hurt when palpated.    Assessment/Plan: 1. Varicose veins of left lower extremity with inflammation Discoloration of left inner, lower and upper leg in pattern of underlying superficial veins. Will get ultrasound of the left lower extremity for further evaluation. Referral and further evaluaiton to be done as indicated.  - VAS [place] LOWER EXTREMITY VENOUS REFLUX; Future  General Counseling: Brittany Reed verbalizes understanding of the findings of today's phone visit and agrees with plan of treatment. I have discussed any further diagnostic evaluation that may be needed or ordered today. We also reviewed her medications today. she has been encouraged to call the office with any questions or concerns that should arise related to todays visit.  This patient was seen by Vincent Gros FNP Collaboration with Dr  Lyndon Code as a part of collaborative care agreement  Time spent: 20 Minutes    Dr Lyndon Code Internal medicine

## 2018-07-18 ENCOUNTER — Other Ambulatory Visit: Payer: Self-pay

## 2018-07-18 ENCOUNTER — Ambulatory Visit: Payer: BLUE CROSS/BLUE SHIELD

## 2018-07-18 DIAGNOSIS — I8312 Varicose veins of left lower extremity with inflammation: Secondary | ICD-10-CM | POA: Diagnosis not present

## 2018-07-31 ENCOUNTER — Ambulatory Visit: Payer: Self-pay | Admitting: Nurse Practitioner

## 2018-09-26 ENCOUNTER — Other Ambulatory Visit: Payer: Self-pay

## 2018-09-26 MED ORDER — LEVOTHYROXINE SODIUM 88 MCG PO TABS: ORAL_TABLET | ORAL | 0 refills | Status: DC

## 2018-11-11 ENCOUNTER — Ambulatory Visit: Payer: Self-pay | Admitting: Nurse Practitioner

## 2019-01-02 ENCOUNTER — Other Ambulatory Visit: Payer: Self-pay

## 2019-01-02 MED ORDER — LEVOTHYROXINE SODIUM 88 MCG PO TABS: ORAL_TABLET | ORAL | 0 refills | Status: DC

## 2019-02-02 ENCOUNTER — Telehealth: Payer: Self-pay

## 2019-02-02 MED ORDER — LEVOTHYROXINE SODIUM 88 MCG PO TABS: ORAL_TABLET | ORAL | 0 refills | Status: DC

## 2019-02-02 NOTE — Telephone Encounter (Signed)
lmom pt need appt for further refills  

## 2019-02-10 ENCOUNTER — Telehealth: Payer: Self-pay

## 2019-02-10 NOTE — Telephone Encounter (Signed)
Called lmom informing patient of appointment. klh 

## 2019-02-12 ENCOUNTER — Other Ambulatory Visit: Payer: Self-pay

## 2019-02-12 ENCOUNTER — Encounter: Payer: Self-pay | Admitting: Adult Health

## 2019-02-12 ENCOUNTER — Ambulatory Visit: Payer: BC Managed Care – PPO | Admitting: Adult Health

## 2019-02-12 VITALS — Temp 98.6°F | Ht 64.0 in

## 2019-02-12 DIAGNOSIS — E039 Hypothyroidism, unspecified: Secondary | ICD-10-CM

## 2019-02-12 MED ORDER — LEVOTHYROXINE SODIUM 88 MCG PO TABS: ORAL_TABLET | ORAL | 0 refills | Status: DC

## 2019-02-12 NOTE — Progress Notes (Signed)
[facility] [place], [place] [address]  Internal MEDICINE  Telephone Visit  Patient Name: Brittany Reed  [id number]  [id number]  Date of Service: 02/12/2019  I connected with the patient at 233 by telephone and verified the patients identity using two identifiers.   I discussed the limitations, risks, security and privacy concerns of performing an evaluation and management service by telephone and the availability of in person appointments. I also discussed with the patient that there may be a patient responsible charge related to the service.  The patient expressed understanding and agrees to proceed.    Chief Complaint  Patient presents with  . Telephone Assessment  . Telephone Screen  . Hypothyroidism    HPI  PT is seen via video for follow up on hypothyroidism.  She has been on snythroid for many years.  Her most recent Thyroid labs were checked last year.  They will need updating at this time.  She denies any issues taking the medication, and does not report any side effects.  She needs a pap smear per quality metric gaps, however she reports she sees GYN in [place] and is up to date.      Current Medication: Outpatient Encounter Medications as of 02/12/2019  Medication Sig  . levothyroxine (SYNTHROID) 88 MCG tablet TAKE 1 TABLET BY MOUTH IN THE MORNING BEFORE BREAKFAST FOR THYROID  . [DISCONTINUED] levothyroxine (SYNTHROID) 88 MCG tablet TAKE 1 TABLET BY MOUTH IN THE MORNING BEFORE BREAKFAST FOR THYROID   No facility-administered encounter medications on file as of 02/12/2019.    Surgical History: Past Surgical History:  Procedure Laterality Date  . APPENDECTOMY  1976  . CESAREAN SECTION     2005, 2008    Medical History: Past Medical History:  Diagnosis Date  . Hypothyroidism     Family History: Family History  Problem Relation Age of Onset  . Hypertension Mother   . Arthritis Mother   . Hyperlipidemia Father   . Hypertension  Father   . Heart disease Father   . Prostate cancer Father   . Stroke Father   . Liver cancer Maternal Grandmother   . Pancreatic cancer Maternal Grandmother     Social History   Socioeconomic History  . Marital status: Married    Spouse name: Not on file  . Number of children: Not on file  . Years of education: Not on file  . Highest education level: Not on file  Occupational History  . Not on file  Tobacco Use  . Smoking status: Former Research scientist (life sciences)  . Smokeless tobacco: Never Used  Substance and Sexual Activity  . Alcohol use: Never  . Drug use: Never  . Sexual activity: Not on file  Other Topics Concern  . Not on file  Social History Narrative  . Not on file   Social Determinants of Health   Financial Resource Strain:   . Difficulty of Paying Living Expenses: Not on file  Food Insecurity:   . Worried About Charity fundraiser in the Last Year: Not on file  . Ran Out of Food in the Last Year: Not on file  Transportation Needs:   . Lack of Transportation (Medical): Not on file  . Lack of Transportation (Non-Medical): Not on file  Physical Activity:   . Days of Exercise per Week: Not on file  . Minutes of Exercise per Session: Not on file  Stress:   . Feeling of Stress : Not on file  Social Connections:   .  Frequency of Communication with Friends and Family: Not on file  . Frequency of Social Gatherings with Friends and Family: Not on file  . Attends Religious Services: Not on file  . Active Member of Clubs or Organizations: Not on file  . Attends Banker Meetings: Not on file  . Marital Status: Not on file  Intimate Partner Violence:   . Fear of Current or Ex-Partner: Not on file  . Emotionally Abused: Not on file  . Physically Abused: Not on file  . Sexually Abused: Not on file      Review of Systems  Constitutional: Negative for chills, fatigue and unexpected weight change.  HENT: Negative for congestion, rhinorrhea, sneezing and sore throat.    Eyes: Negative for photophobia, pain and redness.  Respiratory: Negative for cough, chest tightness and shortness of breath.   Cardiovascular: Negative for chest pain and palpitations.  Gastrointestinal: Negative for abdominal pain, constipation, diarrhea, nausea and vomiting.  Endocrine: Negative.   Genitourinary: Negative for dysuria and frequency.  Musculoskeletal: Negative for arthralgias, back pain, joint swelling and neck pain.  Skin: Negative for rash.  Allergic/Immunologic: Negative.   Neurological: Negative for tremors and numbness.  Hematological: Negative for adenopathy. Does not bruise/bleed easily.  Psychiatric/Behavioral: Negative for behavioral problems and sleep disturbance. The patient is not nervous/anxious.     Vital Signs: Temp 98.6 F (37 C)   Ht 5\' 4"  (1.626 m)   BMI 27.29 kg/m    Observation/Objective:  Well appearing NAD.   Assessment/Plan: 1. Hypothyroidism, unspecified type Physical labs sent for patient, and refill of Synthroid, will follow up with patient for refills one levels are obtained.  - TSH - T4, free - levothyroxine (SYNTHROID) 88 MCG tablet; TAKE 1 TABLET BY MOUTH IN THE MORNING BEFORE BREAKFAST FOR THYROID  Dispense: 30 tablet; Refill: 0      General Counseling: Brittany Reed verbalizes understanding of the findings of today's phone visit and agrees with plan of treatment. I have discussed any further diagnostic evaluation that may be needed or ordered today. We also reviewed her medications today. she has been encouraged to call the office with any questions or concerns that should arise related to todays visit.    Orders Placed This Encounter  Procedures  . CBC with Differential/Platelet  . Lipid Panel With LDL/HDL Ratio  . TSH  . T4, free  . Comprehensive metabolic panel    Meds ordered this encounter  Medications  . levothyroxine (SYNTHROID) 88 MCG tablet    Sig: TAKE 1 TABLET BY MOUTH IN THE MORNING BEFORE BREAKFAST FOR  THYROID    Dispense:  30 tablet    Refill:  0    Time spent: 15 Minutes    AGNP-C Internal medicine

## 2019-04-13 ENCOUNTER — Other Ambulatory Visit: Payer: Self-pay

## 2019-04-13 DIAGNOSIS — E039 Hypothyroidism, unspecified: Secondary | ICD-10-CM

## 2019-04-13 MED ORDER — LEVOTHYROXINE SODIUM 88 MCG PO TABS: ORAL_TABLET | ORAL | 0 refills | Status: DC

## 2019-04-27 DIAGNOSIS — E039 Hypothyroidism, unspecified: Secondary | ICD-10-CM | POA: Diagnosis not present

## 2019-04-27 DIAGNOSIS — Z0001 Encounter for general adult medical examination with abnormal findings: Secondary | ICD-10-CM | POA: Diagnosis not present

## 2019-04-28 LAB — COMPREHENSIVE METABOLIC PANEL
ALT: 16 IU/L (ref 0–32)
AST: 16 IU/L (ref 0–40)
Albumin/Globulin Ratio: 1.8 (ref 1.2–2.2)
Albumin: 4 g/dL (ref 3.8–4.8)
Alkaline Phosphatase: 64 IU/L (ref 39–117)
BUN/Creatinine Ratio: 12 (ref 9–23)
BUN: 10 mg/dL (ref 6–24)
Bilirubin Total: <0.2 mg/dL (ref 0.0–1.2)
CO2: 20 mmol/L (ref 20–29)
Calcium: 9 mg/dL (ref 8.7–10.2)
Chloride: 104 mmol/L (ref 96–106)
Creatinine, Ser: 0.81 mg/dL (ref 0.57–1.00)
GFR calc Af Amer: 99 mL/min/{1.73_m2} (ref >59)
GFR calc non Af Amer: 86 mL/min/{1.73_m2} (ref >59)
Globulin, Total: 2.2 g/dL (ref 1.5–4.5)
Glucose: 86 mg/dL (ref 65–99)
Potassium: 4.7 mmol/L (ref 3.5–5.2)
Sodium: 138 mmol/L (ref 134–144)
Total Protein: 6.2 g/dL (ref 6.0–8.5)

## 2019-04-28 LAB — CBC WITH DIFFERENTIAL/PLATELET
Basophils Absolute: 0.1 10*3/uL (ref 0.0–0.2)
Basos: 1 %
EOS (ABSOLUTE): 0.2 10*3/uL (ref 0.0–0.4)
Eos: 3 %
Hematocrit: 35.7 % (ref 34.0–46.6)
Hemoglobin: 11.9 g/dL (ref 11.1–15.9)
Immature Grans (Abs): 0 10*3/uL (ref 0.0–0.1)
Immature Granulocytes: 0 %
Lymphocytes Absolute: 1.3 10*3/uL (ref 0.7–3.1)
Lymphs: 20 %
MCH: 30.8 pg (ref 26.6–33.0)
MCHC: 33.3 g/dL (ref 31.5–35.7)
MCV: 93 fL (ref 79–97)
Monocytes Absolute: 0.6 10*3/uL (ref 0.1–0.9)
Monocytes: 9 %
Neutrophils Absolute: 4.3 10*3/uL (ref 1.4–7.0)
Neutrophils: 67 %
Platelets: 328 10*3/uL (ref 150–450)
RBC: 3.86 x10E6/uL (ref 3.77–5.28)
RDW: 12.9 % (ref 11.7–15.4)
WBC: 6.6 10*3/uL (ref 3.4–10.8)

## 2019-04-28 LAB — T4, FREE: Free T4: 1.32 ng/dL (ref 0.82–1.77)

## 2019-04-28 LAB — LIPID PANEL WITH LDL/HDL RATIO
Cholesterol, Total: 199 mg/dL (ref 100–199)
HDL: 48 mg/dL (ref >39)
LDL Chol Calc (NIH): 138 mg/dL — ABNORMAL HIGH (ref 0–99)
LDL/HDL Ratio: 2.9 ratio (ref 0.0–3.2)
Triglycerides: 74 mg/dL (ref 0–149)
VLDL Cholesterol Cal: 13 mg/dL (ref 5–40)

## 2019-04-28 LAB — TSH: TSH: 4.16 u[IU]/mL (ref 0.450–4.500)

## 2019-05-20 ENCOUNTER — Other Ambulatory Visit: Payer: Self-pay

## 2019-05-20 DIAGNOSIS — E039 Hypothyroidism, unspecified: Secondary | ICD-10-CM

## 2019-05-20 MED ORDER — LEVOTHYROXINE SODIUM 88 MCG PO TABS: ORAL_TABLET | ORAL | 3 refills | Status: DC

## 2019-06-11 ENCOUNTER — Telehealth: Payer: Self-pay

## 2019-06-11 NOTE — Telephone Encounter (Signed)
Lmom to confirm and screen for 06-15-19 ov.

## 2019-06-12 ENCOUNTER — Telehealth: Payer: Self-pay

## 2019-06-12 NOTE — Telephone Encounter (Signed)
Patient rescheduled appointment on 06/15/2019 to 06/30/2019. klh

## 2019-06-15 ENCOUNTER — Ambulatory Visit: Payer: BC Managed Care – PPO | Admitting: Nurse Practitioner

## 2019-06-26 ENCOUNTER — Telehealth: Payer: Self-pay

## 2019-06-26 NOTE — Telephone Encounter (Signed)
Lmom to confirm and screen for 04-247-21 ov.

## 2019-06-30 ENCOUNTER — Ambulatory Visit: Payer: BC Managed Care – PPO | Admitting: Nurse Practitioner

## 2019-07-02 ENCOUNTER — Telehealth: Payer: Self-pay

## 2019-07-02 NOTE — Telephone Encounter (Signed)
Left message, pt needs to return cal and schedule in office appt for follow up nd refills

## 2019-10-07 ENCOUNTER — Other Ambulatory Visit: Payer: Self-pay | Admitting: Adult Health

## 2019-10-07 DIAGNOSIS — E039 Hypothyroidism, unspecified: Secondary | ICD-10-CM

## 2019-10-07 NOTE — Telephone Encounter (Signed)
THN pt needs AWV

## 2019-10-13 ENCOUNTER — Telehealth: Payer: Self-pay

## 2019-10-13 NOTE — Telephone Encounter (Signed)
I left a message and asked her to call and schedule appointment, can you point me in the right direction so I know how to tell she is thn patient bc I do not see that anywhere on my screen. Beth

## 2019-10-13 NOTE — Telephone Encounter (Signed)
Left message and asked pt to call and schedule appointment for further refills. She is also due for annual physical. Beth

## 2019-11-07 ENCOUNTER — Other Ambulatory Visit: Payer: Self-pay | Admitting: Internal Medicine

## 2019-11-07 DIAGNOSIS — E039 Hypothyroidism, unspecified: Secondary | ICD-10-CM

## 2019-11-08 NOTE — Telephone Encounter (Signed)
Pt needs to be seen for AWV and meds refill, denies meds refill

## 2019-11-11 DIAGNOSIS — D1801 Hemangioma of skin and subcutaneous tissue: Secondary | ICD-10-CM | POA: Diagnosis not present

## 2019-11-11 DIAGNOSIS — L821 Other seborrheic keratosis: Secondary | ICD-10-CM | POA: Diagnosis not present

## 2019-11-11 DIAGNOSIS — D485 Neoplasm of uncertain behavior of skin: Secondary | ICD-10-CM | POA: Diagnosis not present

## 2019-11-11 DIAGNOSIS — D2362 Other benign neoplasm of skin of left upper limb, including shoulder: Secondary | ICD-10-CM | POA: Diagnosis not present

## 2019-11-11 DIAGNOSIS — L814 Other melanin hyperpigmentation: Secondary | ICD-10-CM | POA: Diagnosis not present

## 2019-11-17 ENCOUNTER — Other Ambulatory Visit: Payer: Self-pay

## 2019-11-17 DIAGNOSIS — E039 Hypothyroidism, unspecified: Secondary | ICD-10-CM

## 2019-11-17 MED ORDER — LEVOTHYROXINE SODIUM 88 MCG PO TABS: 88.0000 ug | ORAL_TABLET | Freq: Every day | ORAL | 0 refills | Status: DC

## 2019-12-08 ENCOUNTER — Encounter: Payer: BC Managed Care – PPO | Admitting: Hospice and Palliative Medicine

## 2019-12-16 ENCOUNTER — Other Ambulatory Visit: Payer: Self-pay

## 2019-12-16 DIAGNOSIS — Z6828 Body mass index (BMI) 28.0-28.9, adult: Secondary | ICD-10-CM | POA: Diagnosis not present

## 2019-12-16 DIAGNOSIS — E039 Hypothyroidism, unspecified: Secondary | ICD-10-CM

## 2019-12-16 DIAGNOSIS — F329 Major depressive disorder, single episode, unspecified: Secondary | ICD-10-CM | POA: Diagnosis not present

## 2019-12-16 DIAGNOSIS — Z01419 Encounter for gynecological examination (general) (routine) without abnormal findings: Secondary | ICD-10-CM | POA: Diagnosis not present

## 2019-12-16 DIAGNOSIS — Z1231 Encounter for screening mammogram for malignant neoplasm of breast: Secondary | ICD-10-CM | POA: Diagnosis not present

## 2019-12-16 MED ORDER — LEVOTHYROXINE SODIUM 88 MCG PO TABS: 88.0000 ug | ORAL_TABLET | Freq: Every day | ORAL | 0 refills | Status: DC

## 2019-12-16 NOTE — Telephone Encounter (Signed)
Pt advised we 30 days levothyroxine she need to keep her physical appt for med refills

## 2020-01-07 ENCOUNTER — Ambulatory Visit: Payer: BC Managed Care – PPO | Admitting: Internal Medicine

## 2020-01-07 ENCOUNTER — Telehealth: Payer: Self-pay

## 2020-01-07 ENCOUNTER — Encounter: Payer: Self-pay | Admitting: Internal Medicine

## 2020-01-07 ENCOUNTER — Other Ambulatory Visit: Payer: Self-pay

## 2020-01-07 VITALS — BP 104/78 | HR 66 | Temp 97.3°F | Resp 16 | Ht 64.0 in | Wt 161.4 lb

## 2020-01-07 DIAGNOSIS — Z1211 Encounter for screening for malignant neoplasm of colon: Secondary | ICD-10-CM

## 2020-01-07 DIAGNOSIS — Z23 Encounter for immunization: Secondary | ICD-10-CM | POA: Diagnosis not present

## 2020-01-07 DIAGNOSIS — E039 Hypothyroidism, unspecified: Secondary | ICD-10-CM

## 2020-01-07 DIAGNOSIS — R300 Dysuria: Secondary | ICD-10-CM

## 2020-01-07 DIAGNOSIS — Z0001 Encounter for general adult medical examination with abnormal findings: Secondary | ICD-10-CM | POA: Diagnosis not present

## 2020-01-07 MED ORDER — LEVOTHYROXINE SODIUM 88 MCG PO TABS: 88.0000 ug | ORAL_TABLET | Freq: Every day | ORAL | 3 refills | Status: DC

## 2020-01-07 NOTE — Telephone Encounter (Signed)
Faxed cologuard form 

## 2020-01-07 NOTE — Progress Notes (Signed)
[facility] [address] [place], [place] [address]  Internal MEDICINE  Office Visit Note  Patient Name: Brittany Reed  [id number]  [id number]  Date of Service: 01/07/2020  Chief Complaint  Patient presents with  . Annual Exam    refill request  . policy update form    reviewed  . Quality Metric Gaps    colonoscopy     HPI Pt is here for routine health maintenance examination, denies any major complaints. Just had pap smerar and is scheduled for mammogram, was also started on Lexapro for might depression. She is due for colon cancer screening .  Current Medication: Outpatient Encounter Medications as of 01/07/2020  Medication Sig  . escitalopram (LEXAPRO) 5 MG tablet Take 5 mg by mouth daily.  Marland Kitchen levothyroxine (SYNTHROID) 88 MCG tablet Take 1 tablet (88 mcg total) by mouth daily before breakfast.  . [DISCONTINUED] levothyroxine (SYNTHROID) 88 MCG tablet Take 1 tablet (88 mcg total) by mouth daily before breakfast.   No facility-administered encounter medications on file as of 01/07/2020.    Surgical History: Past Surgical History:  Procedure Laterality Date  . APPENDECTOMY  1976  . CESAREAN SECTION     2005, 2008    Medical History: Past Medical History:  Diagnosis Date  . Hypothyroidism     Family History: Family History  Problem Relation Age of Onset  . Hypertension Mother   . Arthritis Mother   . Hyperlipidemia Father   . Hypertension Father   . Heart disease Father   . Prostate cancer Father   . Stroke Father   . Liver cancer Maternal Grandmother   . Pancreatic cancer Maternal Grandmother       Review of Systems  Constitutional: Negative for chills, diaphoresis and fatigue.  HENT: Negative for ear pain, postnasal drip and sinus pressure.   Eyes: Negative for photophobia, discharge, redness, itching and visual disturbance.  Respiratory: Negative for cough, shortness of breath and wheezing.   Cardiovascular: Negative for chest pain,  palpitations and leg swelling.  Gastrointestinal: Negative for abdominal pain, constipation, diarrhea, nausea and vomiting.  Genitourinary: Negative for dysuria and flank pain.  Musculoskeletal: Negative for arthralgias, back pain, gait problem and neck pain.  Skin: Negative for color change.  Allergic/Immunologic: Negative for environmental allergies and food allergies.  Neurological: Negative for dizziness and headaches.  Hematological: Does not bruise/bleed easily.  Psychiatric/Behavioral: Negative for agitation, behavioral problems (depression) and hallucinations.     Vital Signs: BP 104/78   Pulse 66   Temp (!) 97.3 F (36.3 C)   Resp 16   Ht 5\' 4"  (1.626 m)   Wt 161 lb 6.4 oz (73.2 kg)   SpO2 99%   BMI 27.70 kg/m    Physical Exam Constitutional:      General: She is not in acute distress.    Appearance: Normal appearance. She is well-developed. She is not diaphoretic.  HENT:     Head: Normocephalic and atraumatic.     Mouth/Throat:     Pharynx: No oropharyngeal exudate.  Eyes:     Extraocular Movements: Extraocular movements intact.     Pupils: Pupils are equal, round, and reactive to light.  Neck:     Thyroid: No thyromegaly.     Vascular: No JVD.     Trachea: No tracheal deviation.  Cardiovascular:     Rate and Rhythm: Normal rate and regular rhythm.     Heart sounds: Normal heart sounds. No murmur heard.  No friction rub. No gallop.  Pulmonary:     Effort: Pulmonary effort is normal. No respiratory distress.     Breath sounds: No wheezing or rales.  Chest:     Chest wall: No tenderness.  Musculoskeletal:     Cervical back: Normal range of motion and neck supple.  Lymphadenopathy:     Cervical: No cervical adenopathy.  Skin:    General: Skin is warm and dry.  Neurological:     Mental Status: She is alert and oriented to person, place, and time.     Cranial Nerves: No cranial nerve deficit.  Psychiatric:        Behavior: Behavior normal.         Thought Content: Thought content normal.        Judgment: Judgment normal.       Assessment/Plan: 1. Encounter for general adult medical examination with abnormal findings Update preventive health maintenance   2. Hypothyroidism, unspecified type Continue Synthroid, adjust dosing if needed  - TSH + free T4 - levothyroxine (SYNTHROID) 88 MCG tablet; Take 1 tablet (88 mcg total) by mouth daily before breakfast.  Dispense: 90 tablet; Refill: 3  3. Screening for colon cancer Cologuard is ordered    4. Dysuria - UA/M w/rflx Culture, Routine  5. Flu vaccine need - Flu Vaccine MDCK QUAD PF  General Counseling: Abraham verbalizes understanding of the findings of todays visit and agrees with plan of treatment. I have discussed any further diagnostic evaluation that may be needed or ordered today. We also reviewed her medications today. she has been encouraged to call the office with any questions or concerns that should arise related to todays visit.   Orders Placed This Encounter  Procedures  . Flu Vaccine MDCK QUAD PF  . UA/M w/rflx Culture, Routine  . TSH + free T4    Meds ordered this encounter  Medications  . levothyroxine (SYNTHROID) 88 MCG tablet    Sig: Take 1 tablet (88 mcg total) by mouth daily before breakfast.    Dispense:  90 tablet    Refill:  3    Total time spent:30 Minutes  Time spent includes review of chart, medications, test results, and follow up plan with the patient.     Lyndon Code, MD  Internal Medicine

## 2020-01-08 LAB — UA/M W/RFLX CULTURE, ROUTINE
Bilirubin, UA: NEGATIVE
Glucose, UA: NEGATIVE
Ketones, UA: NEGATIVE
Leukocytes,UA: NEGATIVE
Nitrite, UA: NEGATIVE
Protein,UA: NEGATIVE
RBC, UA: NEGATIVE
Specific Gravity, UA: 1.006 (ref 1.005–1.030)
Urobilinogen, Ur: 0.2 mg/dL (ref 0.2–1.0)
pH, UA: 8 — ABNORMAL HIGH (ref 5.0–7.5)

## 2020-01-08 LAB — MICROSCOPIC EXAMINATION
Bacteria, UA: NONE SEEN
Casts: NONE SEEN /lpf
Epithelial Cells (non renal): NONE SEEN /hpf (ref 0–10)
RBC: NONE SEEN /hpf (ref 0–2)
WBC, UA: NONE SEEN /hpf (ref 0–5)

## 2020-10-07 ENCOUNTER — Telehealth: Payer: Self-pay

## 2020-10-07 NOTE — Telephone Encounter (Signed)
LMOM to see if pt has completed cologuard, test kit expires 01-07-21 

## 2021-01-05 ENCOUNTER — Other Ambulatory Visit: Payer: Self-pay | Admitting: Internal Medicine

## 2021-01-05 DIAGNOSIS — E039 Hypothyroidism, unspecified: Secondary | ICD-10-CM

## 2021-01-06 ENCOUNTER — Encounter: Payer: BC Managed Care – PPO | Admitting: Physician Assistant

## 2021-03-09 ENCOUNTER — Encounter: Payer: BC Managed Care – PPO | Admitting: Physician Assistant

## 2021-03-16 DIAGNOSIS — F32A Depression, unspecified: Secondary | ICD-10-CM | POA: Diagnosis not present

## 2021-03-16 DIAGNOSIS — Z6828 Body mass index (BMI) 28.0-28.9, adult: Secondary | ICD-10-CM | POA: Diagnosis not present

## 2021-03-16 DIAGNOSIS — Z01411 Encounter for gynecological examination (general) (routine) with abnormal findings: Secondary | ICD-10-CM | POA: Diagnosis not present

## 2021-03-16 DIAGNOSIS — Z124 Encounter for screening for malignant neoplasm of cervix: Secondary | ICD-10-CM | POA: Diagnosis not present

## 2021-03-16 DIAGNOSIS — Z13220 Encounter for screening for lipoid disorders: Secondary | ICD-10-CM | POA: Diagnosis not present

## 2021-03-16 DIAGNOSIS — Z0000 Encounter for general adult medical examination without abnormal findings: Secondary | ICD-10-CM | POA: Diagnosis not present

## 2021-03-16 DIAGNOSIS — E039 Hypothyroidism, unspecified: Secondary | ICD-10-CM | POA: Diagnosis not present

## 2021-03-16 DIAGNOSIS — Z1231 Encounter for screening mammogram for malignant neoplasm of breast: Secondary | ICD-10-CM | POA: Diagnosis not present

## 2021-03-16 DIAGNOSIS — Z131 Encounter for screening for diabetes mellitus: Secondary | ICD-10-CM | POA: Diagnosis not present

## 2021-03-16 DIAGNOSIS — Z01419 Encounter for gynecological examination (general) (routine) without abnormal findings: Secondary | ICD-10-CM | POA: Diagnosis not present

## 2021-04-12 ENCOUNTER — Other Ambulatory Visit: Payer: Self-pay | Admitting: Internal Medicine

## 2021-04-12 ENCOUNTER — Telehealth: Payer: Self-pay

## 2021-04-12 DIAGNOSIS — E039 Hypothyroidism, unspecified: Secondary | ICD-10-CM

## 2021-04-12 NOTE — Telephone Encounter (Signed)
LMOM for pt to return my call to see if she is still our pt.  Not seen since 01/2020

## 2021-04-12 NOTE — Telephone Encounter (Signed)
Called and spoke to pt to see if she was still a patient at our office.  She advised she needed to make an appt due to her labs being off that her OB/GYN had done.  We received a request from the pharmacy for her thyroid medication and she thinks her OBGYN dr filled it so I refused the rx and told pt to let us know.  I transferred the call to [facility] to make appt and pt hung up

## 2021-04-18 ENCOUNTER — Other Ambulatory Visit: Payer: Self-pay

## 2021-04-18 DIAGNOSIS — E039 Hypothyroidism, unspecified: Secondary | ICD-10-CM

## 2021-04-18 MED ORDER — LEVOTHYROXINE SODIUM 88 MCG PO TABS: 88.0000 ug | ORAL_TABLET | Freq: Every day | ORAL | 0 refills | Status: DC

## 2021-04-24 ENCOUNTER — Other Ambulatory Visit: Payer: Self-pay

## 2021-04-24 DIAGNOSIS — E039 Hypothyroidism, unspecified: Secondary | ICD-10-CM

## 2021-04-24 MED ORDER — LEVOTHYROXINE SODIUM 88 MCG PO TABS: 88.0000 ug | ORAL_TABLET | Freq: Every day | ORAL | 0 refills | Status: AC

## 2021-04-24 NOTE — Telephone Encounter (Signed)
Pt called that phar don't have pres we send last week resend today for levothyroxine

## 2021-05-08 ENCOUNTER — Telehealth: Payer: Self-pay

## 2021-05-08 NOTE — Telephone Encounter (Signed)
Left vm to confirm 05/11/21 appointment-Toni ?

## 2021-05-10 ENCOUNTER — Telehealth: Payer: Self-pay

## 2021-05-10 NOTE — Telephone Encounter (Signed)
Discharge letter mailed to patient. ?

## 2021-05-11 ENCOUNTER — Encounter: Payer: BC Managed Care – PPO | Admitting: Physician Assistant

## 2021-06-22 DIAGNOSIS — Z1331 Encounter for screening for depression: Secondary | ICD-10-CM | POA: Diagnosis not present

## 2021-06-22 DIAGNOSIS — Z1330 Encounter for screening examination for mental health and behavioral disorders, unspecified: Secondary | ICD-10-CM | POA: Diagnosis not present

## 2021-06-22 DIAGNOSIS — F4323 Adjustment disorder with mixed anxiety and depressed mood: Secondary | ICD-10-CM | POA: Diagnosis not present

## 2021-06-22 DIAGNOSIS — Z1211 Encounter for screening for malignant neoplasm of colon: Secondary | ICD-10-CM | POA: Diagnosis not present

## 2021-06-22 DIAGNOSIS — E039 Hypothyroidism, unspecified: Secondary | ICD-10-CM | POA: Diagnosis not present

## 2021-06-22 DIAGNOSIS — E785 Hyperlipidemia, unspecified: Secondary | ICD-10-CM | POA: Diagnosis not present

## 2021-08-30 DIAGNOSIS — Z0000 Encounter for general adult medical examination without abnormal findings: Secondary | ICD-10-CM | POA: Diagnosis not present

## 2021-08-30 DIAGNOSIS — Z1331 Encounter for screening for depression: Secondary | ICD-10-CM | POA: Diagnosis not present

## 2021-08-30 DIAGNOSIS — E039 Hypothyroidism, unspecified: Secondary | ICD-10-CM | POA: Diagnosis not present

## 2021-08-30 DIAGNOSIS — F4323 Adjustment disorder with mixed anxiety and depressed mood: Secondary | ICD-10-CM | POA: Diagnosis not present
